# Patient Record
Sex: Male | Born: 1995 | Race: Asian | Hispanic: No | Marital: Single | Smoking: Never smoker
Health system: Southern US, Community
[De-identification: ages and names within clinical notes are randomized; demographics above are authoritative.]

## PROBLEM LIST (undated history)

## (undated) DIAGNOSIS — H5000 Unspecified esotropia: Secondary | ICD-10-CM

## (undated) DIAGNOSIS — R55 Syncope and collapse: Secondary | ICD-10-CM

## (undated) DIAGNOSIS — H547 Unspecified visual loss: Secondary | ICD-10-CM

## (undated) HISTORY — DX: Unspecified esotropia: H50.00

## (undated) HISTORY — DX: Syncope and collapse: R55

## (undated) HISTORY — DX: Unspecified visual loss: H54.7

---
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
## 2018-12-15 ENCOUNTER — Telehealth: Payer: Self-pay | Admitting: *Deleted
